# Patient Record
Sex: Female | Born: 1965 | Race: White | Hispanic: No | Marital: Married | Smoking: Never smoker
Health system: Southern US, Community
[De-identification: ages and names within clinical notes are randomized; demographics above are authoritative.]

## PROBLEM LIST (undated history)

## (undated) DIAGNOSIS — C4490 Unspecified malignant neoplasm of skin, unspecified: Secondary | ICD-10-CM

## (undated) DIAGNOSIS — N840 Polyp of corpus uteri: Secondary | ICD-10-CM

## (undated) HISTORY — PX: CYSTECTOMY: SUR359

## (undated) HISTORY — PX: BREAST BIOPSY: SHX20

## (undated) HISTORY — PX: BREAST CYST ASPIRATION: SHX578

## (undated) HISTORY — DX: Polyp of corpus uteri: N84.0

---

## 2003-04-09 HISTORY — PX: BREAST BIOPSY: SHX20

## 2004-03-26 ENCOUNTER — Ambulatory Visit: Payer: Self-pay

## 2004-05-18 ENCOUNTER — Ambulatory Visit: Payer: Self-pay

## 2005-04-03 ENCOUNTER — Ambulatory Visit: Payer: Self-pay | Admitting: General Surgery

## 2005-07-23 ENCOUNTER — Ambulatory Visit: Payer: Self-pay | Admitting: General Surgery

## 2006-04-28 ENCOUNTER — Ambulatory Visit: Payer: Self-pay | Admitting: General Surgery

## 2006-05-02 ENCOUNTER — Ambulatory Visit: Payer: Self-pay | Admitting: General Surgery

## 2006-07-01 ENCOUNTER — Ambulatory Visit: Payer: Self-pay | Admitting: Internal Medicine

## 2006-11-03 ENCOUNTER — Ambulatory Visit: Payer: Self-pay | Admitting: Internal Medicine

## 2007-01-15 ENCOUNTER — Ambulatory Visit: Payer: Self-pay | Admitting: Internal Medicine

## 2007-05-12 ENCOUNTER — Ambulatory Visit: Payer: Self-pay | Admitting: General Surgery

## 2007-05-18 ENCOUNTER — Ambulatory Visit: Payer: Self-pay | Admitting: General Surgery

## 2008-05-16 ENCOUNTER — Ambulatory Visit: Payer: Self-pay | Admitting: General Surgery

## 2009-08-01 ENCOUNTER — Ambulatory Visit: Payer: Self-pay

## 2010-08-28 ENCOUNTER — Ambulatory Visit: Payer: Self-pay | Admitting: Internal Medicine

## 2010-08-30 ENCOUNTER — Ambulatory Visit: Payer: Self-pay | Admitting: Internal Medicine

## 2010-12-14 ENCOUNTER — Ambulatory Visit: Payer: Self-pay | Admitting: Internal Medicine

## 2011-09-03 ENCOUNTER — Ambulatory Visit: Payer: Self-pay | Admitting: Internal Medicine

## 2011-11-28 ENCOUNTER — Encounter: Payer: Self-pay | Admitting: Obstetrics and Gynecology

## 2011-11-28 ENCOUNTER — Ambulatory Visit (INDEPENDENT_AMBULATORY_CARE_PROVIDER_SITE_OTHER): Payer: Self-pay | Admitting: Obstetrics and Gynecology

## 2011-11-28 VITALS — BP 133/72 | HR 66 | Temp 97.3°F | Ht 67.5 in | Wt 151.3 lb

## 2011-11-28 DIAGNOSIS — N939 Abnormal uterine and vaginal bleeding, unspecified: Secondary | ICD-10-CM

## 2011-11-28 DIAGNOSIS — N926 Irregular menstruation, unspecified: Secondary | ICD-10-CM

## 2011-11-28 NOTE — Progress Notes (Signed)
  Subjective:    Patient ID: Debbie Perkins, female    DOB: 1966/01/26, 46 y.o.   MRN: [id number]  HPI 46 yo G2P2 with BMI 23 who presents today requesting a second opinion on vaginal bleeding. Patient reports an episode of abnormal bleeding in November 2012 where her period lasted longer than normal. Her GYn in [place] ordered an HSG which demonstrated a 10 mm filling defect (these records are being requested). It was recommended that the patient have a D&C. During that period of time, patient was taken care of an ill mother and did not follow-up with D&C. She reports not having a period since that abnormal period in November until June 2013. She states that her periods have been back to normal since that time, occuring every 28 days or so, and lasting 5 days without excessive bleeding. Patient was seen by Dr. Tresa Res in June and offered repeat ultrasound and endometrial biopsy. Patient declined both due to cost.  Past Medical History  Diagnosis Date  . Endometrial polyp 8 years ago     surgical removal    Past Surgical History  Procedure Date  . Cystectomy     breast cyst removed   Family History  Problem Relation Age of Onset  . Cancer Mother   . Diabetes Mother   . Heart attack Mother   . Diabetes Father   . Cancer Brother   . Diabetes Son    History  Substance Use Topics  . Smoking status: Never Smoker   . Smokeless tobacco: Never Used  . Alcohol Use: No      Review of Systems  All other systems reviewed and are negative.       Objective:   Physical Exam Pelvic: normal vaginal mucosa and cervix. Small uterus, no palpable adnexal mass or tenderness     Assessment & Plan:  46 yo G2P2 with an isolated episode of abnormal bleeding Discussed the need to review ultrasound from November, since patient cannot afford to have it repeated - Lengthy conversation on etiology of abnormal bleeding- possible endometrial polyp, possible perimenopausal phase, possibility of  endometrial cancer - Discussed performing endometrial biopsy or D&C to remove polyp and rule out endometrial cancer. - Patient desires to wait and observe her periods for any abnormality and will return at that time for endometrial biopsy - Awaiting records for review

## 2011-12-30 ENCOUNTER — Telehealth: Payer: Self-pay | Admitting: *Deleted

## 2011-12-30 NOTE — Telephone Encounter (Signed)
Message copied by Jill Side on Mon Dec 30, 2011  4:45 PM ------      Message from: CONSTANT, PEGGY      Created: Mon Dec 30, 2011  4:21 PM       Please inform patient that records have been reviewed. It does appear that she has endometrial polyps. Plan of care remains the same, patient should have D&C to remove the polyps but if she is no longer having bleeding issues, it is okay to observe.       Patient was instructed to return to our clinic if she started to bleed again.            Thanks      [employer]

## 2011-12-30 NOTE — Telephone Encounter (Signed)
I called pt and informed her of Dr. Deretha Emory message.  Pt voiced understanding and asked if she can have her routine Gyn care @ our office. I stated that she can have her care here and may make an appt when her next Pap is due.  Pt also asked questions about the charity care program which I answered. She will bring the paperwork to our office this week.  Pt voiced understanding and had no additional questions.

## 2011-12-30 NOTE — Telephone Encounter (Signed)
Pt saw Dr. Jolayne Panther on 8/22 and thought that at that visit Dr. Jolayne Panther was going to get her records from [facility] and review them. She was under the impression that someone from our office would be calling her to let her know what Dr. Jolayne Panther thought after reviewing the records.

## 2011-12-30 NOTE — Telephone Encounter (Signed)
Records reviewed. Ultrasound from January 2013 demonstrates a normal uterus with a 3.4 mm endometrial lining. Results of sonohysterogram not seen but appreaciated MD note referring to the presence of endometrial polyp and need for D&C. Plan of care remains the same. Patient was advised for [facility] hysteroscopy/polypectomy but due to lack of insurance opted to observe and return to our office if bleeding returned.

## 2012-09-23 ENCOUNTER — Ambulatory Visit: Payer: Self-pay

## 2013-11-15 ENCOUNTER — Ambulatory Visit: Payer: Self-pay | Admitting: Internal Medicine

## 2014-02-07 ENCOUNTER — Encounter: Payer: Self-pay | Admitting: Obstetrics and Gynecology

## 2014-02-07 ENCOUNTER — Ambulatory Visit: Payer: Self-pay | Admitting: Gastroenterology

## 2014-08-01 LAB — SURGICAL PATHOLOGY

## 2014-12-19 ENCOUNTER — Other Ambulatory Visit: Payer: Self-pay | Admitting: Internal Medicine

## 2014-12-19 DIAGNOSIS — Z1231 Encounter for screening mammogram for malignant neoplasm of breast: Secondary | ICD-10-CM

## 2015-01-02 ENCOUNTER — Ambulatory Visit
Admission: RE | Admit: 2015-01-02 | Discharge: 2015-01-02 | Disposition: A | Discharge status: Home / Self Care | Payer: BLUE CROSS/BLUE SHIELD | Source: Ambulatory Visit | Attending: Internal Medicine | Admitting: Internal Medicine

## 2015-01-02 DIAGNOSIS — Z1231 Encounter for screening mammogram for malignant neoplasm of breast: Secondary | ICD-10-CM | POA: Diagnosis present

## 2016-01-02 ENCOUNTER — Other Ambulatory Visit: Payer: Self-pay | Admitting: Internal Medicine

## 2016-01-02 DIAGNOSIS — Z1231 Encounter for screening mammogram for malignant neoplasm of breast: Secondary | ICD-10-CM

## 2016-02-05 ENCOUNTER — Ambulatory Visit
Admission: RE | Admit: 2016-02-05 | Discharge: 2016-02-05 | Disposition: A | Discharge status: Home / Self Care | Payer: BLUE CROSS/BLUE SHIELD | Source: Ambulatory Visit | Attending: Internal Medicine | Admitting: Internal Medicine

## 2016-02-05 DIAGNOSIS — Z1231 Encounter for screening mammogram for malignant neoplasm of breast: Secondary | ICD-10-CM | POA: Diagnosis not present

## 2017-01-14 ENCOUNTER — Other Ambulatory Visit: Payer: Self-pay | Admitting: Internal Medicine

## 2017-01-14 DIAGNOSIS — Z1231 Encounter for screening mammogram for malignant neoplasm of breast: Secondary | ICD-10-CM

## 2017-02-10 ENCOUNTER — Ambulatory Visit
Admission: RE | Admit: 2017-02-10 | Discharge: 2017-02-10 | Disposition: A | Discharge status: Home / Self Care | Payer: BLUE CROSS/BLUE SHIELD | Source: Ambulatory Visit | Attending: Internal Medicine | Admitting: Internal Medicine

## 2017-02-10 DIAGNOSIS — Z1231 Encounter for screening mammogram for malignant neoplasm of breast: Secondary | ICD-10-CM | POA: Diagnosis not present

## 2018-02-17 ENCOUNTER — Other Ambulatory Visit: Payer: Self-pay | Admitting: Internal Medicine

## 2018-02-17 DIAGNOSIS — Z1231 Encounter for screening mammogram for malignant neoplasm of breast: Secondary | ICD-10-CM

## 2018-02-25 ENCOUNTER — Ambulatory Visit
Admission: RE | Admit: 2018-02-25 | Discharge: 2018-02-25 | Disposition: A | Discharge status: Home / Self Care | Payer: BLUE CROSS/BLUE SHIELD | Source: Ambulatory Visit | Attending: Internal Medicine | Admitting: Internal Medicine

## 2018-02-25 DIAGNOSIS — Z1231 Encounter for screening mammogram for malignant neoplasm of breast: Secondary | ICD-10-CM | POA: Diagnosis not present

## 2018-02-25 HISTORY — DX: Unspecified malignant neoplasm of skin, unspecified: C44.90

## 2018-11-17 DIAGNOSIS — K589 Irritable bowel syndrome without diarrhea: Secondary | ICD-10-CM | POA: Insufficient documentation

## 2018-11-17 DIAGNOSIS — R002 Palpitations: Secondary | ICD-10-CM | POA: Insufficient documentation

## 2018-11-17 DIAGNOSIS — D649 Anemia, unspecified: Secondary | ICD-10-CM | POA: Insufficient documentation

## 2018-11-17 DIAGNOSIS — I7300 Raynaud's syndrome without gangrene: Secondary | ICD-10-CM | POA: Insufficient documentation

## 2018-11-17 DIAGNOSIS — R739 Hyperglycemia, unspecified: Secondary | ICD-10-CM | POA: Insufficient documentation

## 2019-07-14 DIAGNOSIS — J189 Pneumonia, unspecified organism: Secondary | ICD-10-CM | POA: Insufficient documentation

## 2019-07-14 DIAGNOSIS — U071 COVID-19: Secondary | ICD-10-CM | POA: Insufficient documentation

## 2019-10-06 DIAGNOSIS — J301 Allergic rhinitis due to pollen: Secondary | ICD-10-CM | POA: Insufficient documentation

## 2019-10-06 DIAGNOSIS — R0982 Postnasal drip: Secondary | ICD-10-CM | POA: Insufficient documentation

## 2020-03-20 DIAGNOSIS — R030 Elevated blood-pressure reading, without diagnosis of hypertension: Secondary | ICD-10-CM | POA: Insufficient documentation

## 2020-03-20 DIAGNOSIS — Z1239 Encounter for other screening for malignant neoplasm of breast: Secondary | ICD-10-CM | POA: Insufficient documentation

## 2020-03-20 DIAGNOSIS — E782 Mixed hyperlipidemia: Secondary | ICD-10-CM | POA: Insufficient documentation

## 2020-05-12 ENCOUNTER — Other Ambulatory Visit: Payer: Self-pay | Admitting: Internal Medicine

## 2020-05-12 ENCOUNTER — Other Ambulatory Visit: Payer: Self-pay | Admitting: Infectious Diseases

## 2020-05-12 DIAGNOSIS — Z1231 Encounter for screening mammogram for malignant neoplasm of breast: Secondary | ICD-10-CM

## 2020-06-05 ENCOUNTER — Ambulatory Visit
Admission: RE | Admit: 2020-06-05 | Discharge: 2020-06-05 | Disposition: A | Discharge status: Home / Self Care | Payer: 59 | Source: Ambulatory Visit | Attending: Infectious Diseases | Admitting: Infectious Diseases

## 2020-06-05 ENCOUNTER — Other Ambulatory Visit: Payer: Self-pay

## 2020-06-05 DIAGNOSIS — Z1231 Encounter for screening mammogram for malignant neoplasm of breast: Secondary | ICD-10-CM | POA: Insufficient documentation

## 2020-06-06 ENCOUNTER — Other Ambulatory Visit: Payer: Self-pay | Admitting: *Deleted

## 2020-06-06 ENCOUNTER — Inpatient Hospital Stay
Admission: RE | Admit: 2020-06-06 | Discharge: 2020-06-06 | Disposition: A | Discharge status: Home / Self Care | Payer: Self-pay | Source: Ambulatory Visit | Attending: *Deleted | Admitting: *Deleted

## 2020-06-06 DIAGNOSIS — Z1231 Encounter for screening mammogram for malignant neoplasm of breast: Secondary | ICD-10-CM

## 2021-05-08 DIAGNOSIS — E1169 Type 2 diabetes mellitus with other specified complication: Secondary | ICD-10-CM | POA: Diagnosis not present

## 2021-05-08 DIAGNOSIS — E785 Hyperlipidemia, unspecified: Secondary | ICD-10-CM | POA: Diagnosis not present

## 2021-05-08 DIAGNOSIS — E1165 Type 2 diabetes mellitus with hyperglycemia: Secondary | ICD-10-CM | POA: Diagnosis not present

## 2021-05-08 DIAGNOSIS — Z0000 Encounter for general adult medical examination without abnormal findings: Secondary | ICD-10-CM | POA: Diagnosis not present

## 2021-05-10 DIAGNOSIS — E1169 Type 2 diabetes mellitus with other specified complication: Secondary | ICD-10-CM | POA: Diagnosis not present

## 2021-05-10 DIAGNOSIS — E785 Hyperlipidemia, unspecified: Secondary | ICD-10-CM | POA: Diagnosis not present

## 2021-05-10 DIAGNOSIS — E1165 Type 2 diabetes mellitus with hyperglycemia: Secondary | ICD-10-CM | POA: Diagnosis not present

## 2021-05-10 DIAGNOSIS — R030 Elevated blood-pressure reading, without diagnosis of hypertension: Secondary | ICD-10-CM | POA: Diagnosis not present

## 2021-05-14 DIAGNOSIS — M4122 Other idiopathic scoliosis, cervical region: Secondary | ICD-10-CM | POA: Diagnosis not present

## 2021-05-14 DIAGNOSIS — M549 Dorsalgia, unspecified: Secondary | ICD-10-CM | POA: Diagnosis not present

## 2021-05-16 DIAGNOSIS — M549 Dorsalgia, unspecified: Secondary | ICD-10-CM | POA: Diagnosis not present

## 2021-05-16 DIAGNOSIS — M8588 Other specified disorders of bone density and structure, other site: Secondary | ICD-10-CM | POA: Diagnosis not present

## 2021-05-16 DIAGNOSIS — M4185 Other forms of scoliosis, thoracolumbar region: Secondary | ICD-10-CM | POA: Diagnosis not present

## 2021-07-16 DIAGNOSIS — R030 Elevated blood-pressure reading, without diagnosis of hypertension: Secondary | ICD-10-CM | POA: Diagnosis not present

## 2021-07-16 DIAGNOSIS — R7303 Prediabetes: Secondary | ICD-10-CM | POA: Diagnosis not present

## 2021-07-23 DIAGNOSIS — Z0000 Encounter for general adult medical examination without abnormal findings: Secondary | ICD-10-CM | POA: Diagnosis not present

## 2021-07-23 DIAGNOSIS — R7303 Prediabetes: Secondary | ICD-10-CM | POA: Diagnosis not present

## 2021-07-23 DIAGNOSIS — L309 Dermatitis, unspecified: Secondary | ICD-10-CM | POA: Diagnosis not present

## 2021-07-23 DIAGNOSIS — R030 Elevated blood-pressure reading, without diagnosis of hypertension: Secondary | ICD-10-CM | POA: Diagnosis not present

## 2021-08-01 ENCOUNTER — Other Ambulatory Visit: Payer: Self-pay | Admitting: Infectious Diseases

## 2021-08-01 DIAGNOSIS — Z1231 Encounter for screening mammogram for malignant neoplasm of breast: Secondary | ICD-10-CM

## 2021-08-07 DIAGNOSIS — E1165 Type 2 diabetes mellitus with hyperglycemia: Secondary | ICD-10-CM | POA: Diagnosis not present

## 2021-08-17 DIAGNOSIS — Z03818 Encounter for observation for suspected exposure to other biological agents ruled out: Secondary | ICD-10-CM | POA: Diagnosis not present

## 2021-08-17 DIAGNOSIS — J029 Acute pharyngitis, unspecified: Secondary | ICD-10-CM | POA: Diagnosis not present

## 2021-08-17 DIAGNOSIS — Z20822 Contact with and (suspected) exposure to covid-19: Secondary | ICD-10-CM | POA: Diagnosis not present

## 2021-08-20 ENCOUNTER — Ambulatory Visit: Payer: Self-pay

## 2021-09-17 DIAGNOSIS — L821 Other seborrheic keratosis: Secondary | ICD-10-CM | POA: Diagnosis not present

## 2021-09-17 DIAGNOSIS — L814 Other melanin hyperpigmentation: Secondary | ICD-10-CM | POA: Diagnosis not present

## 2021-09-17 DIAGNOSIS — Z7189 Other specified counseling: Secondary | ICD-10-CM | POA: Diagnosis not present

## 2021-09-17 DIAGNOSIS — L918 Other hypertrophic disorders of the skin: Secondary | ICD-10-CM | POA: Diagnosis not present

## 2021-09-17 DIAGNOSIS — D2372 Other benign neoplasm of skin of left lower limb, including hip: Secondary | ICD-10-CM | POA: Diagnosis not present

## 2021-09-17 DIAGNOSIS — L905 Scar conditions and fibrosis of skin: Secondary | ICD-10-CM | POA: Diagnosis not present

## 2021-09-24 ENCOUNTER — Ambulatory Visit
Admission: RE | Admit: 2021-09-24 | Discharge: 2021-09-24 | Disposition: A | Discharge status: Home / Self Care | Payer: 59 | Source: Ambulatory Visit | Attending: Infectious Diseases | Admitting: Infectious Diseases

## 2021-09-24 DIAGNOSIS — Z1231 Encounter for screening mammogram for malignant neoplasm of breast: Secondary | ICD-10-CM | POA: Diagnosis not present

## 2021-10-13 DIAGNOSIS — J019 Acute sinusitis, unspecified: Secondary | ICD-10-CM | POA: Diagnosis not present

## 2021-10-13 DIAGNOSIS — B9689 Other specified bacterial agents as the cause of diseases classified elsewhere: Secondary | ICD-10-CM | POA: Diagnosis not present

## 2021-10-24 ENCOUNTER — Ambulatory Visit (INDEPENDENT_AMBULATORY_CARE_PROVIDER_SITE_OTHER): Payer: 59

## 2021-10-24 ENCOUNTER — Ambulatory Visit
Admission: EM | Admit: 2021-10-24 | Discharge: 2021-10-24 | Disposition: A | Discharge status: Home / Self Care | Payer: 59 | Attending: Emergency Medicine | Admitting: Emergency Medicine

## 2021-10-24 DIAGNOSIS — J029 Acute pharyngitis, unspecified: Secondary | ICD-10-CM

## 2021-10-24 DIAGNOSIS — R059 Cough, unspecified: Secondary | ICD-10-CM | POA: Diagnosis not present

## 2021-10-24 DIAGNOSIS — R051 Acute cough: Secondary | ICD-10-CM

## 2021-10-24 DIAGNOSIS — R079 Chest pain, unspecified: Secondary | ICD-10-CM | POA: Diagnosis not present

## 2021-10-24 LAB — POCT RAPID STREP A (OFFICE): Rapid Strep A Screen: NEGATIVE

## 2021-10-24 MED ORDER — LIDOCAINE VISCOUS HCL 2 % MT SOLN: 15.0000 mL | OROMUCOSAL | 0 refills | Status: AC | PRN

## 2021-10-24 NOTE — ED Triage Notes (Signed)
Patient presents to Urgent Care with complaints of sore throat x 1 day. Noted some white patches. States she was on amoxicillin for cough. Finished treatment continues to have concern for pneumonia.

## 2021-10-24 NOTE — ED Provider Notes (Signed)
Debbie Perkins    CSN: [id number] Arrival date & time: 10/24/21  1828      History   Chief Complaint Chief Complaint  Patient presents with   Sore Throat    HPI Debbie Perkins is a 56 y.o. female.  Patient presents with 1 day history of sore throat and white patches in the back of her throat.  She also reports ongoing cough and left lateral rib cage pain with deep breaths.  No falls or injury.  Patient was seen at [facility] on 10/13/2021; diagnosed with sinusitis; treated with Augmentin, Tessalon Perles, Mucinex.  Her cough has improved significantly since treatment.  No fever, chills, rash, shortness of breath, chest pain, vomiting, diarrhea, or other symptoms.  Patient is concerned for pneumonia; she requests chest x-ray; history of pneumonia.    The history is provided by the patient and medical records.    Past Medical History:  Diagnosis Date   Endometrial polyp 8 years ago    surgical removal    Skin cancer     Patient Active Problem List   Diagnosis Date Noted   Elevated blood pressure reading 03/20/2020   Encounter for screening for malignant neoplasm of breast 03/20/2020   Mixed hyperlipidemia 03/20/2020   Post-nasal drip 10/06/2019   Seasonal allergic rhinitis due to pollen 10/06/2019   COVID-19 07/14/2019   Pneumonia 07/14/2019   Anemia 11/17/2018   Hyperglycemia 11/17/2018   Irritable bowel syndrome 11/17/2018   Palpitations 11/17/2018   Raynaud's phenomenon 11/17/2018   Abnormal uterine bleeding 11/28/2011    Past Surgical History:  Procedure Laterality Date   BREAST BIOPSY Left 1990's   excisional - neg   BREAST BIOPSY Right 2005   excisional  - neg   BREAST CYST ASPIRATION N/A 1990's   CYSTECTOMY     breast cyst removed    OB History     Gravida  2   Para  2   Term  2   Preterm  0   AB  0   Living  2      SAB  0   IAB  0   Ectopic  0   Multiple  0   Live Births               Home Medications    Prior  to Admission medications   Medication Sig Start Date End Date Taking? Authorizing Provider  lidocaine (XYLOCAINE) 2 % solution Use as directed 15 mLs in the mouth or throat as needed for mouth pain. 10/24/21  Yes Sharion Balloon, NP    Family History Family History  Problem Relation Age of Onset   Cancer Mother    Diabetes Mother    Heart attack Mother    Diabetes Father    Cancer Brother    Diabetes Son    Breast cancer Neg Hx     Social History Social History   Tobacco Use   Smoking status: Never   Smokeless tobacco: Never  Substance Use Topics   Alcohol use: No   Drug use: No     Allergies   Patient has no known allergies.   Review of Systems Review of Systems  Constitutional:  Negative for chills and fever.  HENT:  Positive for sore throat. Negative for ear pain.   Respiratory:  Positive for cough. Negative for shortness of breath.   Cardiovascular:  Negative for chest pain and palpitations.  Gastrointestinal:  Negative for diarrhea and vomiting.  Skin:  Negative for color change and rash.  All other systems reviewed and are negative.    Physical Exam Triage Vital Signs ED Triage Vitals  Enc Vitals Group     BP 10/24/21 1834 133/76     Pulse Rate 10/24/21 1834 79     Resp 10/24/21 1834 18     Temp 10/24/21 1834 98.8 F (37.1 C)     Temp src --      SpO2 10/24/21 1834 97 %     Weight --      Height --      Head Circumference --      Peak Flow --      Pain Score 10/24/21 1838 0     Pain Loc --      Pain Edu? --      Excl. in Waves? --    No data found.  Updated Vital Signs BP 133/76   Pulse 79   Temp 98.8 F (37.1 C)   Resp 18   LMP 11/18/2011   SpO2 97%   Visual Acuity Right Eye Distance:   Left Eye Distance:   Bilateral Distance:    Right Eye Near:   Left Eye Near:    Bilateral Near:     Physical Exam Vitals and nursing note reviewed.  Constitutional:      General: She is not in acute distress.    Appearance: Normal appearance.  She is well-developed. She is not ill-appearing.  HENT:     Right Ear: Tympanic membrane normal.     Left Ear: Tympanic membrane normal.     Nose: Nose normal.     Mouth/Throat:     Mouth: Mucous membranes are moist.     Pharynx: Posterior oropharyngeal erythema present.  Cardiovascular:     Rate and Rhythm: Normal rate and regular rhythm.     Heart sounds: Normal heart sounds.  Pulmonary:     Effort: Pulmonary effort is normal. No respiratory distress.     Breath sounds: Normal breath sounds.  Musculoskeletal:     Cervical back: Neck supple.  Skin:    General: Skin is warm and dry.  Neurological:     Mental Status: She is alert.  Psychiatric:        Mood and Affect: Mood normal.        Behavior: Behavior normal.      UC Treatments / Results  Labs (all labs ordered are listed, but only abnormal results are displayed) Labs Reviewed  POCT RAPID STREP A (OFFICE)    EKG   Radiology DG Chest 2 View  Result Date: 10/24/2021 CLINICAL DATA:  Cough and chest pain. EXAM: CHEST - 2 VIEW COMPARISON:  Chest CT 2008 FINDINGS: The cardiac silhouette, mediastinal and hilar contours are within normal limits. The lungs are clear of an acute process such as infiltrates or effusions. Minimal streaky basilar scarring changes are noted. No pleural effusions. No pulmonary lesions. Significant S shaped thoracolumbar scoliosis but no acute bony findings. IMPRESSION: No acute cardiopulmonary findings. Electronically Signed   By: Marijo Sanes M.D.   On: 10/24/2021 19:14    Procedures Procedures (including critical care time)  Medications Ordered in UC Medications - No data to display  Initial Impression / Assessment and Plan / UC Course  I have reviewed the triage vital signs and the nursing notes.  Pertinent labs & imaging results that were available during my care of the patient were reviewed by me and considered in my medical decision making (  see chart for details).  Cough, sore throat.   Patient is well-appearing and her exam is reassuring.  Afebrile and vital signs are stable.  Rapid strep negative.  Chest x-ray clear.  Treating sore throat with viscous lidocaine.  Discussed Tylenol or ibuprofen as needed.  Instructed patient to follow up with her PCP if her symptoms are not improving.  She agrees to plan of care.     Final Clinical Impressions(s) / UC Diagnoses   Final diagnoses:  Acute cough  Sore throat     Discharge Instructions      Your strep test is negative.  The chest x-ray is normal.    Take Tylenol or ibuprofen as needed for sore throat.  Use the viscous lidocaine as directed.  Follow up with your primary care provider if your symptoms are not improving.        ED Prescriptions     Medication Sig Dispense Auth. Provider   lidocaine (XYLOCAINE) 2 % solution Use as directed 15 mLs in the mouth or throat as needed for mouth pain. 100 mL Sharion Balloon, NP      PDMP not reviewed this encounter.   Sharion Balloon, NP 10/24/21 1930

## 2021-10-24 NOTE — Discharge Instructions (Addendum)
Your strep test is negative.  The chest x-ray is normal.    Take Tylenol or ibuprofen as needed for sore throat.  Use the viscous lidocaine as directed.  Follow up with your primary care provider if your symptoms are not improving.

## 2021-10-31 DIAGNOSIS — E1169 Type 2 diabetes mellitus with other specified complication: Secondary | ICD-10-CM | POA: Diagnosis not present

## 2021-10-31 DIAGNOSIS — R030 Elevated blood-pressure reading, without diagnosis of hypertension: Secondary | ICD-10-CM | POA: Diagnosis not present

## 2021-10-31 DIAGNOSIS — E785 Hyperlipidemia, unspecified: Secondary | ICD-10-CM | POA: Diagnosis not present

## 2021-10-31 DIAGNOSIS — E1165 Type 2 diabetes mellitus with hyperglycemia: Secondary | ICD-10-CM | POA: Diagnosis not present

## 2021-11-07 DIAGNOSIS — R030 Elevated blood-pressure reading, without diagnosis of hypertension: Secondary | ICD-10-CM | POA: Diagnosis not present

## 2021-11-07 DIAGNOSIS — Z0000 Encounter for general adult medical examination without abnormal findings: Secondary | ICD-10-CM | POA: Diagnosis not present

## 2021-11-07 DIAGNOSIS — E785 Hyperlipidemia, unspecified: Secondary | ICD-10-CM | POA: Diagnosis not present

## 2021-11-07 DIAGNOSIS — E1165 Type 2 diabetes mellitus with hyperglycemia: Secondary | ICD-10-CM | POA: Diagnosis not present

## 2021-11-07 DIAGNOSIS — E1169 Type 2 diabetes mellitus with other specified complication: Secondary | ICD-10-CM | POA: Diagnosis not present

## 2021-11-07 DIAGNOSIS — E11319 Type 2 diabetes mellitus with unspecified diabetic retinopathy without macular edema: Secondary | ICD-10-CM | POA: Diagnosis not present

## 2022-02-19 DIAGNOSIS — R0789 Other chest pain: Secondary | ICD-10-CM | POA: Diagnosis not present

## 2022-02-19 DIAGNOSIS — M4122 Other idiopathic scoliosis, cervical region: Secondary | ICD-10-CM | POA: Diagnosis not present

## 2022-02-25 DIAGNOSIS — J302 Other seasonal allergic rhinitis: Secondary | ICD-10-CM | POA: Diagnosis not present

## 2022-02-25 DIAGNOSIS — J018 Other acute sinusitis: Secondary | ICD-10-CM | POA: Diagnosis not present

## 2022-02-27 ENCOUNTER — Other Ambulatory Visit: Payer: Self-pay | Admitting: Infectious Diseases

## 2022-02-27 DIAGNOSIS — R0789 Other chest pain: Secondary | ICD-10-CM

## 2022-02-27 DIAGNOSIS — M4122 Other idiopathic scoliosis, cervical region: Secondary | ICD-10-CM

## 2022-05-06 DIAGNOSIS — E785 Hyperlipidemia, unspecified: Secondary | ICD-10-CM | POA: Diagnosis not present

## 2022-05-06 DIAGNOSIS — R030 Elevated blood-pressure reading, without diagnosis of hypertension: Secondary | ICD-10-CM | POA: Diagnosis not present

## 2022-05-06 DIAGNOSIS — E1169 Type 2 diabetes mellitus with other specified complication: Secondary | ICD-10-CM | POA: Diagnosis not present

## 2022-05-06 DIAGNOSIS — Z0000 Encounter for general adult medical examination without abnormal findings: Secondary | ICD-10-CM | POA: Diagnosis not present

## 2022-05-06 DIAGNOSIS — E1165 Type 2 diabetes mellitus with hyperglycemia: Secondary | ICD-10-CM | POA: Diagnosis not present

## 2022-05-13 DIAGNOSIS — E1169 Type 2 diabetes mellitus with other specified complication: Secondary | ICD-10-CM | POA: Diagnosis not present

## 2022-05-13 DIAGNOSIS — E11319 Type 2 diabetes mellitus with unspecified diabetic retinopathy without macular edema: Secondary | ICD-10-CM | POA: Diagnosis not present

## 2022-05-13 DIAGNOSIS — E785 Hyperlipidemia, unspecified: Secondary | ICD-10-CM | POA: Diagnosis not present

## 2022-05-13 DIAGNOSIS — R030 Elevated blood-pressure reading, without diagnosis of hypertension: Secondary | ICD-10-CM | POA: Diagnosis not present

## 2022-05-13 DIAGNOSIS — E1165 Type 2 diabetes mellitus with hyperglycemia: Secondary | ICD-10-CM | POA: Diagnosis not present

## 2022-05-22 ENCOUNTER — Ambulatory Visit
Admission: RE | Admit: 2022-05-22 | Discharge: 2022-05-22 | Disposition: A | Discharge status: Home / Self Care | Payer: 59 | Source: Ambulatory Visit | Attending: Infectious Diseases | Admitting: Infectious Diseases

## 2022-05-22 DIAGNOSIS — J9811 Atelectasis: Secondary | ICD-10-CM | POA: Diagnosis not present

## 2022-05-22 DIAGNOSIS — R911 Solitary pulmonary nodule: Secondary | ICD-10-CM | POA: Diagnosis not present

## 2022-05-22 DIAGNOSIS — M4122 Other idiopathic scoliosis, cervical region: Secondary | ICD-10-CM

## 2022-05-22 DIAGNOSIS — R0789 Other chest pain: Secondary | ICD-10-CM

## 2022-05-22 DIAGNOSIS — Q676 Pectus excavatum: Secondary | ICD-10-CM | POA: Diagnosis not present

## 2022-05-22 DIAGNOSIS — R079 Chest pain, unspecified: Secondary | ICD-10-CM | POA: Diagnosis not present

## 2022-05-24 ENCOUNTER — Other Ambulatory Visit: Payer: Self-pay | Admitting: Infectious Diseases

## 2022-05-24 DIAGNOSIS — R9341 Abnormal radiologic findings on diagnostic imaging of renal pelvis, ureter, or bladder: Secondary | ICD-10-CM

## 2022-05-24 DIAGNOSIS — R109 Unspecified abdominal pain: Secondary | ICD-10-CM

## 2022-05-27 ENCOUNTER — Ambulatory Visit
Admission: RE | Admit: 2022-05-27 | Discharge: 2022-05-27 | Disposition: A | Discharge status: Home / Self Care | Payer: 59 | Source: Ambulatory Visit | Attending: Infectious Diseases | Admitting: Infectious Diseases

## 2022-05-27 DIAGNOSIS — R109 Unspecified abdominal pain: Secondary | ICD-10-CM | POA: Diagnosis not present

## 2022-05-27 DIAGNOSIS — R9341 Abnormal radiologic findings on diagnostic imaging of renal pelvis, ureter, or bladder: Secondary | ICD-10-CM | POA: Diagnosis not present

## 2022-05-27 DIAGNOSIS — N281 Cyst of kidney, acquired: Secondary | ICD-10-CM | POA: Diagnosis not present

## 2022-07-22 DIAGNOSIS — Z0000 Encounter for general adult medical examination without abnormal findings: Secondary | ICD-10-CM | POA: Diagnosis not present

## 2022-07-22 DIAGNOSIS — L309 Dermatitis, unspecified: Secondary | ICD-10-CM | POA: Diagnosis not present

## 2022-07-22 DIAGNOSIS — R7303 Prediabetes: Secondary | ICD-10-CM | POA: Diagnosis not present

## 2022-07-22 DIAGNOSIS — R030 Elevated blood-pressure reading, without diagnosis of hypertension: Secondary | ICD-10-CM | POA: Diagnosis not present

## 2022-07-30 DIAGNOSIS — Z1231 Encounter for screening mammogram for malignant neoplasm of breast: Secondary | ICD-10-CM | POA: Diagnosis not present

## 2022-07-30 DIAGNOSIS — R030 Elevated blood-pressure reading, without diagnosis of hypertension: Secondary | ICD-10-CM | POA: Diagnosis not present

## 2022-07-30 DIAGNOSIS — R739 Hyperglycemia, unspecified: Secondary | ICD-10-CM | POA: Diagnosis not present

## 2022-07-30 DIAGNOSIS — Z0000 Encounter for general adult medical examination without abnormal findings: Secondary | ICD-10-CM | POA: Diagnosis not present

## 2022-07-30 DIAGNOSIS — I7300 Raynaud's syndrome without gangrene: Secondary | ICD-10-CM | POA: Diagnosis not present

## 2022-07-30 DIAGNOSIS — R7303 Prediabetes: Secondary | ICD-10-CM | POA: Diagnosis not present

## 2022-07-31 ENCOUNTER — Other Ambulatory Visit: Payer: Self-pay | Admitting: Infectious Diseases

## 2022-07-31 DIAGNOSIS — Z1231 Encounter for screening mammogram for malignant neoplasm of breast: Secondary | ICD-10-CM

## 2022-09-23 DIAGNOSIS — L814 Other melanin hyperpigmentation: Secondary | ICD-10-CM | POA: Diagnosis not present

## 2022-09-23 DIAGNOSIS — L821 Other seborrheic keratosis: Secondary | ICD-10-CM | POA: Diagnosis not present

## 2022-09-23 DIAGNOSIS — L089 Local infection of the skin and subcutaneous tissue, unspecified: Secondary | ICD-10-CM | POA: Diagnosis not present

## 2022-09-23 DIAGNOSIS — L218 Other seborrheic dermatitis: Secondary | ICD-10-CM | POA: Diagnosis not present

## 2022-09-23 DIAGNOSIS — D2372 Other benign neoplasm of skin of left lower limb, including hip: Secondary | ICD-10-CM | POA: Diagnosis not present

## 2022-09-23 DIAGNOSIS — Z7189 Other specified counseling: Secondary | ICD-10-CM | POA: Diagnosis not present

## 2022-09-23 DIAGNOSIS — L905 Scar conditions and fibrosis of skin: Secondary | ICD-10-CM | POA: Diagnosis not present

## 2022-10-16 ENCOUNTER — Ambulatory Visit
Admission: RE | Admit: 2022-10-16 | Discharge: 2022-10-16 | Disposition: A | Discharge status: Home / Self Care | Payer: 59 | Source: Ambulatory Visit | Attending: Infectious Diseases | Admitting: Infectious Diseases

## 2022-10-16 DIAGNOSIS — Z1231 Encounter for screening mammogram for malignant neoplasm of breast: Secondary | ICD-10-CM | POA: Diagnosis not present

## 2022-11-15 DIAGNOSIS — E785 Hyperlipidemia, unspecified: Secondary | ICD-10-CM | POA: Diagnosis not present

## 2022-11-15 DIAGNOSIS — E1165 Type 2 diabetes mellitus with hyperglycemia: Secondary | ICD-10-CM | POA: Diagnosis not present

## 2022-11-15 DIAGNOSIS — R030 Elevated blood-pressure reading, without diagnosis of hypertension: Secondary | ICD-10-CM | POA: Diagnosis not present

## 2022-11-15 DIAGNOSIS — E11319 Type 2 diabetes mellitus with unspecified diabetic retinopathy without macular edema: Secondary | ICD-10-CM | POA: Diagnosis not present

## 2022-11-15 DIAGNOSIS — E1169 Type 2 diabetes mellitus with other specified complication: Secondary | ICD-10-CM | POA: Diagnosis not present

## 2023-01-21 DIAGNOSIS — R7303 Prediabetes: Secondary | ICD-10-CM | POA: Diagnosis not present

## 2023-03-31 DIAGNOSIS — M4184 Other forms of scoliosis, thoracic region: Secondary | ICD-10-CM | POA: Diagnosis not present

## 2023-03-31 DIAGNOSIS — M4124 Other idiopathic scoliosis, thoracic region: Secondary | ICD-10-CM | POA: Diagnosis not present

## 2023-03-31 DIAGNOSIS — R079 Chest pain, unspecified: Secondary | ICD-10-CM | POA: Diagnosis not present

## 2023-03-31 DIAGNOSIS — M438X4 Other specified deforming dorsopathies, thoracic region: Secondary | ICD-10-CM | POA: Diagnosis not present

## 2023-03-31 DIAGNOSIS — M549 Dorsalgia, unspecified: Secondary | ICD-10-CM | POA: Diagnosis not present

## 2023-05-01 IMAGING — MG MM DIGITAL SCREENING BILAT W/ TOMO AND CAD
8 series · 8 of 24 positions shown · non-contrast
Comparison: Previous exam(s).

CLINICAL DATA: Screening.

EXAM:
DIGITAL SCREENING BILATERAL MAMMOGRAM WITH TOMOSYNTHESIS AND CAD
TECHNIQUE: Bilateral screening digital craniocaudal and mediolateral oblique
mammograms were obtained. Bilateral screening digital breast
tomosynthesis was performed. The images were evaluated with
computer-aided detection.

[R CC synth-2D]
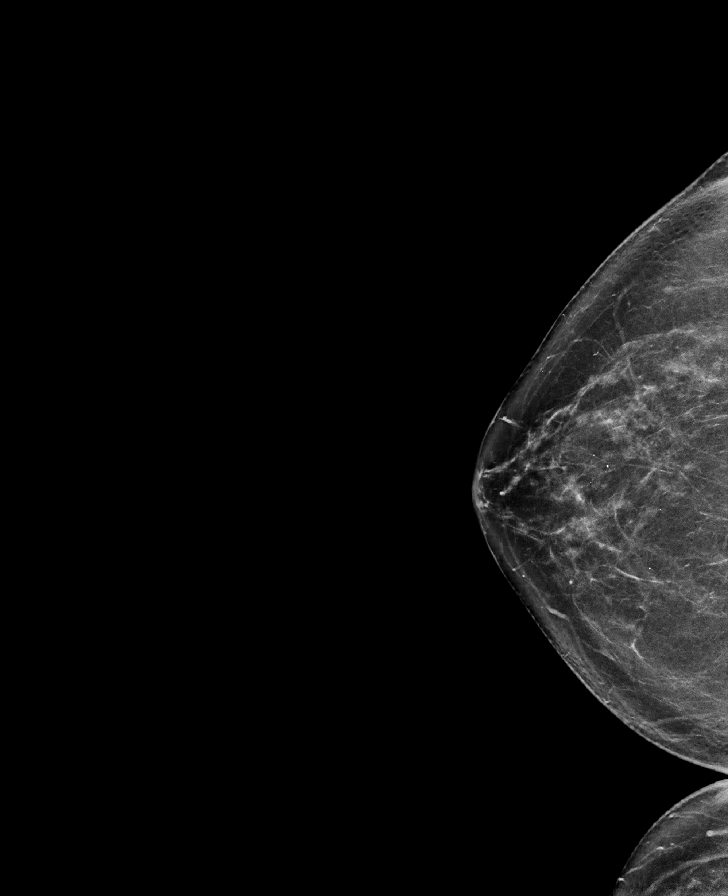

[R MLO synth-2D]
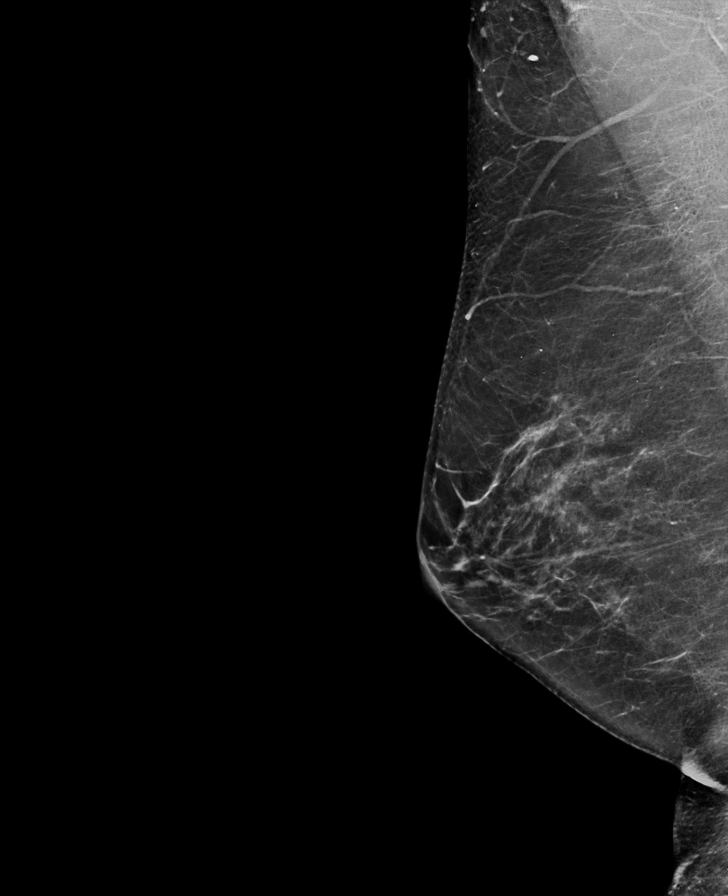

[L MLO synth-2D]
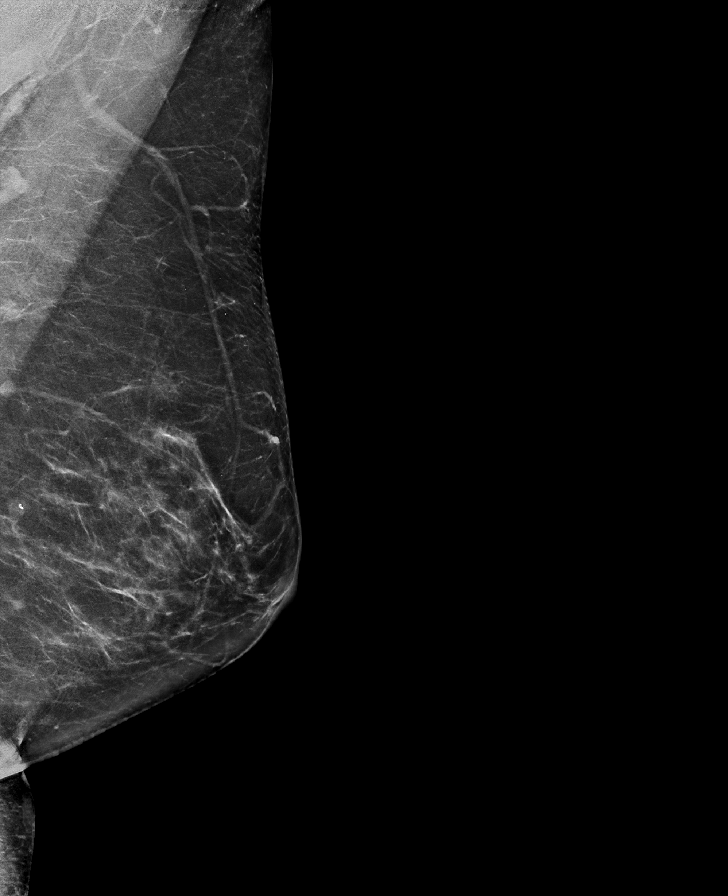

[L CC synth-2D]
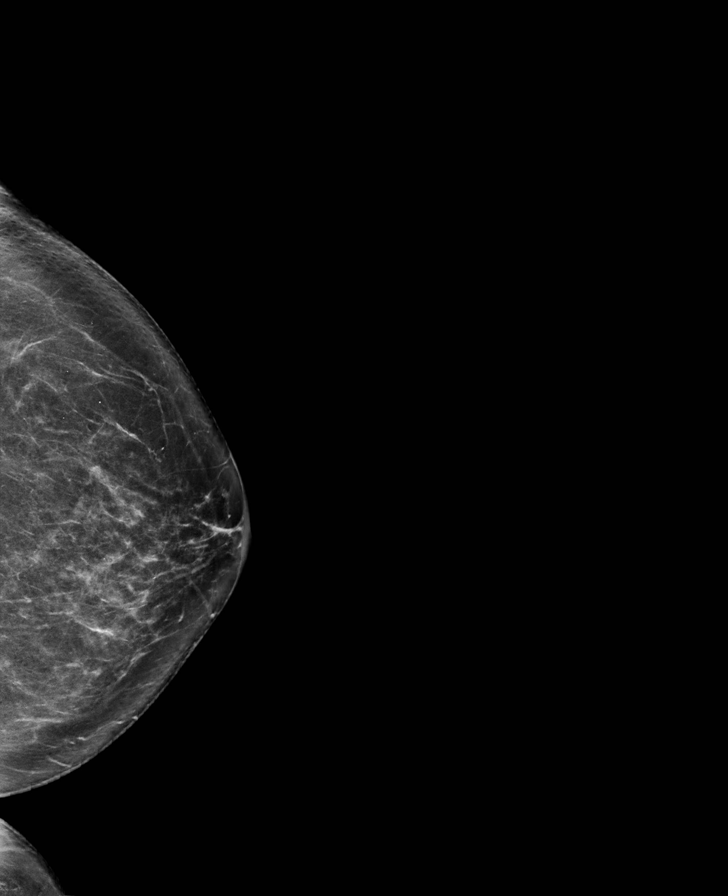

[L MLO tomo · tomo slice 40/79.0]
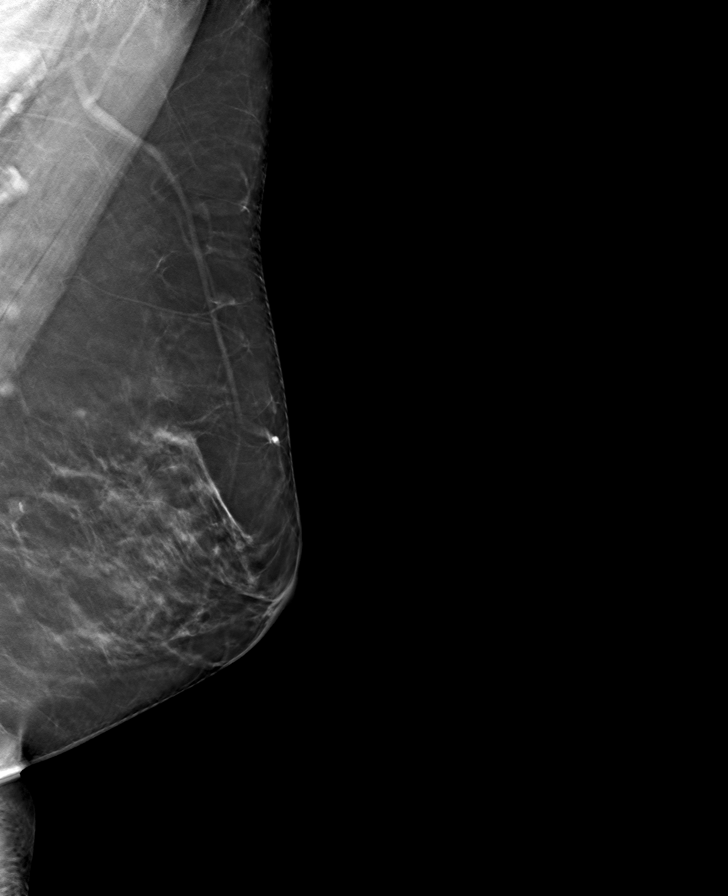

[L CC tomo · tomo slice 39/76.0]
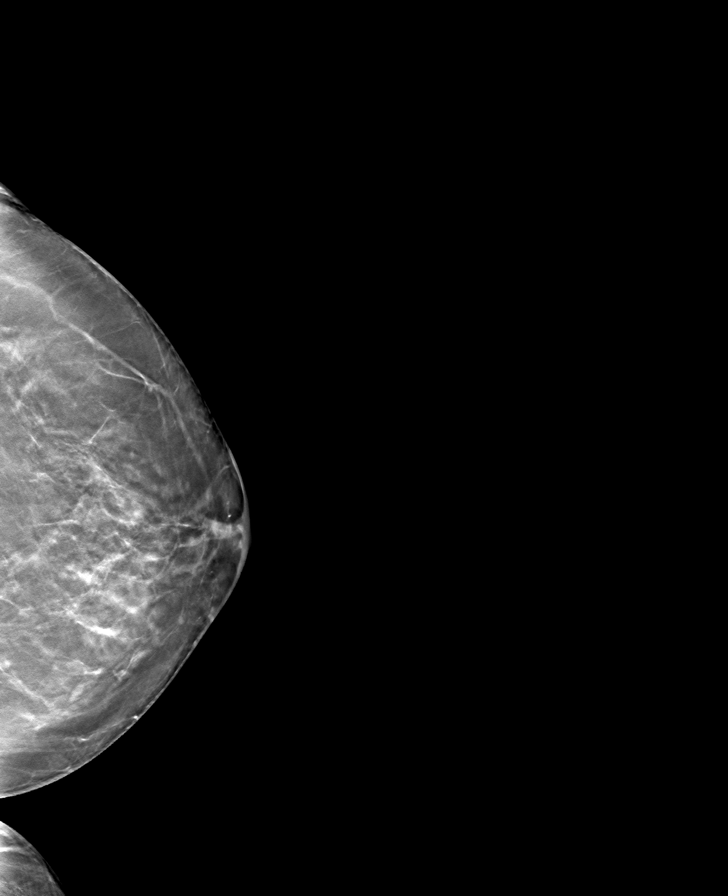

[R MLO tomo · tomo slice 37/74.0]
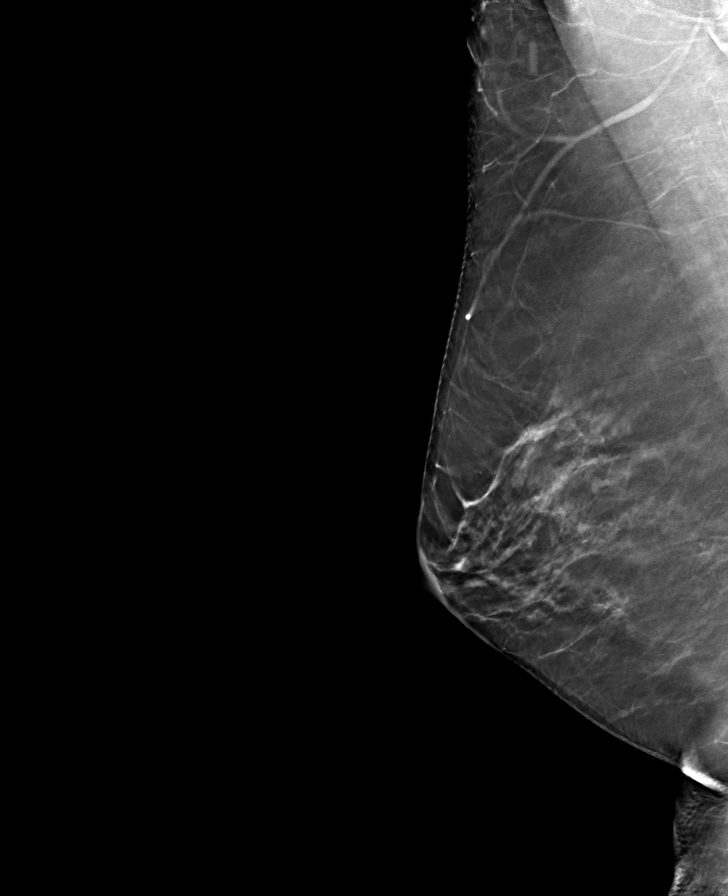

[R CC tomo · tomo slice 37/73.0]
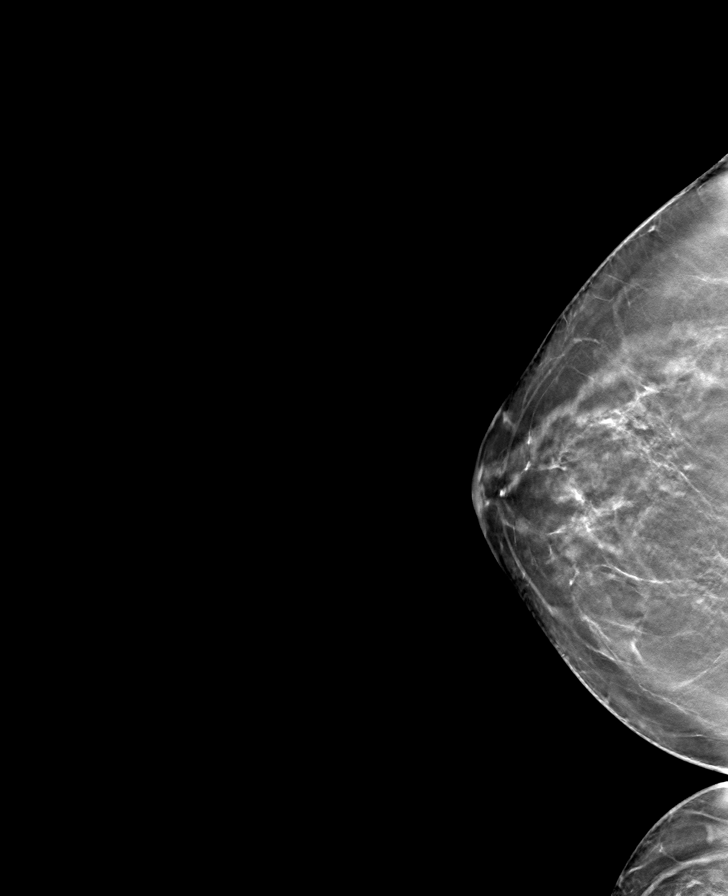

[8 of 24 positions shown; findings below may reference images not displayed]

ACR Breast Density Category b: There are scattered areas of
fibroglandular density.
FINDINGS: There are no findings suspicious for malignancy.
IMPRESSION: No mammographic evidence of malignancy. A result letter of this
screening mammogram will be mailed directly to the patient.

RECOMMENDATION:
Screening mammogram in one year. (Code:51-O-LD2)

BI-RADS CATEGORY  1: Negative.
